# Patient Record
Sex: Male | Born: 1985 | Hispanic: Yes | Marital: Single | Smoking: Current some day smoker
Health system: Southern US, Community
[De-identification: ages and names within clinical notes are randomized; demographics above are authoritative.]

---

## 2015-05-30 ENCOUNTER — Encounter (HOSPITAL_COMMUNITY): Payer: Self-pay | Admitting: *Deleted

## 2015-05-30 ENCOUNTER — Encounter (HOSPITAL_BASED_OUTPATIENT_CLINIC_OR_DEPARTMENT_OTHER)
Admission: RE | Disposition: A | Discharge status: Home / Self Care | Payer: Self-pay | Source: Ambulatory Visit | Attending: Orthopedic Surgery

## 2015-05-30 ENCOUNTER — Ambulatory Visit (HOSPITAL_BASED_OUTPATIENT_CLINIC_OR_DEPARTMENT_OTHER): Payer: Worker's Compensation | Admitting: Anesthesiology

## 2015-05-30 ENCOUNTER — Emergency Department (HOSPITAL_COMMUNITY): Payer: Self-pay

## 2015-05-30 ENCOUNTER — Encounter (HOSPITAL_BASED_OUTPATIENT_CLINIC_OR_DEPARTMENT_OTHER): Payer: Self-pay | Admitting: *Deleted

## 2015-05-30 ENCOUNTER — Emergency Department (HOSPITAL_COMMUNITY)
Admission: EM | Admit: 2015-05-30 | Discharge: 2015-05-30 | Disposition: A | Discharge status: Home / Self Care | Payer: Self-pay | Attending: Emergency Medicine | Admitting: Emergency Medicine

## 2015-05-30 ENCOUNTER — Ambulatory Visit (HOSPITAL_BASED_OUTPATIENT_CLINIC_OR_DEPARTMENT_OTHER)
Admission: RE | Admit: 2015-05-30 | Discharge: 2015-05-30 | Disposition: A | Discharge status: Home / Self Care | Payer: Worker's Compensation | Source: Ambulatory Visit | Attending: Orthopedic Surgery | Admitting: Orthopedic Surgery

## 2015-05-30 ENCOUNTER — Other Ambulatory Visit: Payer: Self-pay | Admitting: Orthopedic Surgery

## 2015-05-30 DIAGNOSIS — X58XXXA Exposure to other specified factors, initial encounter: Secondary | ICD-10-CM | POA: Insufficient documentation

## 2015-05-30 DIAGNOSIS — S62602B Fracture of unspecified phalanx of right middle finger, initial encounter for open fracture: Secondary | ICD-10-CM

## 2015-05-30 DIAGNOSIS — Y929 Unspecified place or not applicable: Secondary | ICD-10-CM | POA: Diagnosis not present

## 2015-05-30 DIAGNOSIS — F1210 Cannabis abuse, uncomplicated: Secondary | ICD-10-CM | POA: Diagnosis not present

## 2015-05-30 DIAGNOSIS — Y9289 Other specified places as the place of occurrence of the external cause: Secondary | ICD-10-CM | POA: Insufficient documentation

## 2015-05-30 DIAGNOSIS — S62632B Displaced fracture of distal phalanx of right middle finger, initial encounter for open fracture: Secondary | ICD-10-CM | POA: Insufficient documentation

## 2015-05-30 DIAGNOSIS — Z23 Encounter for immunization: Secondary | ICD-10-CM | POA: Insufficient documentation

## 2015-05-30 DIAGNOSIS — Z720 Tobacco use: Secondary | ICD-10-CM | POA: Insufficient documentation

## 2015-05-30 DIAGNOSIS — F17210 Nicotine dependence, cigarettes, uncomplicated: Secondary | ICD-10-CM | POA: Insufficient documentation

## 2015-05-30 DIAGNOSIS — Y9389 Activity, other specified: Secondary | ICD-10-CM | POA: Insufficient documentation

## 2015-05-30 DIAGNOSIS — S62633B Displaced fracture of distal phalanx of left middle finger, initial encounter for open fracture: Secondary | ICD-10-CM | POA: Insufficient documentation

## 2015-05-30 DIAGNOSIS — Y990 Civilian activity done for income or pay: Secondary | ICD-10-CM | POA: Insufficient documentation

## 2015-05-30 DIAGNOSIS — S61214A Laceration without foreign body of right ring finger without damage to nail, initial encounter: Secondary | ICD-10-CM

## 2015-05-30 DIAGNOSIS — W208XXA Other cause of strike by thrown, projected or falling object, initial encounter: Secondary | ICD-10-CM | POA: Insufficient documentation

## 2015-05-30 HISTORY — PX: NAILBED REPAIR: SHX5028

## 2015-05-30 HISTORY — PX: FINGER CLOSED REDUCTION: SHX1633

## 2015-05-30 SURGERY — REPAIR, NAIL BED
Anesthesia: General | Site: Finger | Laterality: Right

## 2015-05-30 MED ORDER — MIDAZOLAM HCL 2 MG/2ML IJ SOLN
1.0000 mg | INTRAMUSCULAR | Status: DC | PRN
Start: 2015-05-30 — End: 2015-05-30
  Administered 2015-05-30: 2 mg via INTRAVENOUS

## 2015-05-30 MED ORDER — CEFAZOLIN SODIUM 1-5 GM-% IV SOLN
1.0000 g | Freq: Once | INTRAVENOUS | Status: AC
  Administered 2015-05-30: 1 g via INTRAVENOUS
  Filled 2015-05-30 (×2): qty 50

## 2015-05-30 MED ORDER — OXYCODONE-ACETAMINOPHEN 5-325 MG PO TABS: 1.0000 | ORAL_TABLET | ORAL | Status: AC | PRN

## 2015-05-30 MED ORDER — LIDOCAINE HCL (CARDIAC) 20 MG/ML IV SOLN
INTRAVENOUS | Status: DC | PRN
  Administered 2015-05-30: 80 mg via INTRAVENOUS

## 2015-05-30 MED ORDER — ONDANSETRON HCL 4 MG/2ML IJ SOLN
INTRAMUSCULAR | Status: DC | PRN
  Administered 2015-05-30: 4 mg via INTRAVENOUS

## 2015-05-30 MED ORDER — CHLORHEXIDINE GLUCONATE 4 % EX LIQD: 60.0000 mL | Freq: Once | CUTANEOUS | Status: DC

## 2015-05-30 MED ORDER — CEFAZOLIN SODIUM-DEXTROSE 2-3 GM-% IV SOLR
INTRAVENOUS | Status: AC
  Filled 2015-05-30: qty 50

## 2015-05-30 MED ORDER — FENTANYL CITRATE (PF) 100 MCG/2ML IJ SOLN: 25.0000 ug | INTRAMUSCULAR | Status: DC | PRN

## 2015-05-30 MED ORDER — GLYCOPYRROLATE 0.2 MG/ML IJ SOLN
0.2000 mg | Freq: Once | INTRAMUSCULAR | Status: AC | PRN
  Administered 2015-05-30: 0.2 mg via INTRAVENOUS

## 2015-05-30 MED ORDER — LACTATED RINGERS IV SOLN
INTRAVENOUS | Status: DC
  Administered 2015-05-30: 14:00:00 via INTRAVENOUS

## 2015-05-30 MED ORDER — FENTANYL CITRATE (PF) 100 MCG/2ML IJ SOLN
INTRAMUSCULAR | Status: AC
  Filled 2015-05-30: qty 4

## 2015-05-30 MED ORDER — ONDANSETRON HCL 4 MG/2ML IJ SOLN: 4.0000 mg | Freq: Four times a day (QID) | INTRAMUSCULAR | Status: DC | PRN

## 2015-05-30 MED ORDER — LIDOCAINE HCL 2 % IJ SOLN
10.0000 mL | Freq: Once | INTRAMUSCULAR | Status: AC
  Administered 2015-05-30: 200 mg
  Filled 2015-05-30: qty 20

## 2015-05-30 MED ORDER — SCOPOLAMINE 1 MG/3DAYS TD PT72: 1.0000 | MEDICATED_PATCH | Freq: Once | TRANSDERMAL | Status: DC | PRN

## 2015-05-30 MED ORDER — PROPOFOL 10 MG/ML IV BOLUS
INTRAVENOUS | Status: DC | PRN
  Administered 2015-05-30: 200 mg via INTRAVENOUS

## 2015-05-30 MED ORDER — BUPIVACAINE HCL (PF) 0.25 % IJ SOLN
INTRAMUSCULAR | Status: DC | PRN
  Administered 2015-05-30: 2 mL

## 2015-05-30 MED ORDER — DEXAMETHASONE SODIUM PHOSPHATE 4 MG/ML IJ SOLN
INTRAMUSCULAR | Status: DC | PRN
  Administered 2015-05-30: 10 mg via INTRAVENOUS

## 2015-05-30 MED ORDER — TETANUS-DIPHTH-ACELL PERTUSSIS 5-2.5-18.5 LF-MCG/0.5 IM SUSP
0.5000 mL | Freq: Once | INTRAMUSCULAR | Status: AC
  Administered 2015-05-30: 0.5 mL via INTRAMUSCULAR
  Filled 2015-05-30: qty 0.5

## 2015-05-30 MED ORDER — OXYCODONE HCL 5 MG PO TABS
ORAL_TABLET | ORAL | Status: AC
  Filled 2015-05-30: qty 1

## 2015-05-30 MED ORDER — OXYCODONE HCL 5 MG/5ML PO SOLN: 5.0000 mg | Freq: Once | ORAL | Status: AC | PRN

## 2015-05-30 MED ORDER — MIDAZOLAM HCL 2 MG/2ML IJ SOLN
INTRAMUSCULAR | Status: AC
  Filled 2015-05-30: qty 2

## 2015-05-30 MED ORDER — HYDROCODONE-ACETAMINOPHEN 5-325 MG PO TABS
2.0000 | ORAL_TABLET | Freq: Once | ORAL | Status: AC
  Administered 2015-05-30: 2 via ORAL
  Filled 2015-05-30: qty 2

## 2015-05-30 MED ORDER — FENTANYL CITRATE (PF) 100 MCG/2ML IJ SOLN
50.0000 ug | INTRAMUSCULAR | Status: DC | PRN
  Administered 2015-05-30: 100 ug via INTRAVENOUS

## 2015-05-30 MED ORDER — OXYCODONE HCL 5 MG PO TABS
5.0000 mg | ORAL_TABLET | Freq: Once | ORAL | Status: AC | PRN
  Administered 2015-05-30: 5 mg via ORAL

## 2015-05-30 MED ORDER — CEFAZOLIN SODIUM-DEXTROSE 2-3 GM-% IV SOLR
2.0000 g | INTRAVENOUS | Status: AC
  Administered 2015-05-30: 2 g via INTRAVENOUS

## 2015-05-30 SURGICAL SUPPLY — 67 items
APL SKNCLS STERI-STRIP NONHPOA (GAUZE/BANDAGES/DRESSINGS)
BANDAGE ELASTIC 3 VELCRO ST LF (GAUZE/BANDAGES/DRESSINGS) IMPLANT
BANDAGE ELASTIC 4 VELCRO ST LF (GAUZE/BANDAGES/DRESSINGS) IMPLANT
BENZOIN TINCTURE PRP APPL 2/3 (GAUZE/BANDAGES/DRESSINGS) IMPLANT
BLADE SURG 15 STRL LF DISP TIS (BLADE) ×2 IMPLANT
BLADE SURG 15 STRL SS (BLADE) ×4
BNDG CMPR 9X4 STRL LF SNTH (GAUZE/BANDAGES/DRESSINGS) ×2
BNDG CMPR MD 5X2 ELC HKLP STRL (GAUZE/BANDAGES/DRESSINGS)
BNDG COHESIVE 1X5 TAN STRL LF (GAUZE/BANDAGES/DRESSINGS) ×4 IMPLANT
BNDG ELASTIC 2 VLCR STRL LF (GAUZE/BANDAGES/DRESSINGS) IMPLANT
BNDG ESMARK 4X9 LF (GAUZE/BANDAGES/DRESSINGS) ×4 IMPLANT
BNDG GAUZE ELAST 4 BULKY (GAUZE/BANDAGES/DRESSINGS) IMPLANT
CANISTER SUCT 1200ML W/VALVE (MISCELLANEOUS) IMPLANT
CLOSURE WOUND 1/2 X4 (GAUZE/BANDAGES/DRESSINGS)
CORDS BIPOLAR (ELECTRODE) IMPLANT
COVER BACK TABLE 60X90IN (DRAPES) ×4 IMPLANT
CUFF TOURNIQUET SINGLE 18IN (TOURNIQUET CUFF) ×4 IMPLANT
DECANTER SPIKE VIAL GLASS SM (MISCELLANEOUS) IMPLANT
DRAPE EXTREMITY T 121X128X90 (DRAPE) ×4 IMPLANT
DRAPE OEC MINIVIEW 54X84 (DRAPES) ×4 IMPLANT
DRAPE SURG 17X23 STRL (DRAPES) ×4 IMPLANT
DURAPREP 26ML APPLICATOR (WOUND CARE) ×4 IMPLANT
GAUZE SPONGE 4X4 12PLY STRL (GAUZE/BANDAGES/DRESSINGS) ×4 IMPLANT
GAUZE SPONGE 4X4 16PLY XRAY LF (GAUZE/BANDAGES/DRESSINGS) IMPLANT
GAUZE XEROFORM 1X8 LF (GAUZE/BANDAGES/DRESSINGS) ×4 IMPLANT
GLOVE BIO SURGEON STRL SZ 6.5 (GLOVE) ×3 IMPLANT
GLOVE BIO SURGEONS STRL SZ 6.5 (GLOVE) ×1
GLOVE BIOGEL PI IND STRL 6.5 (GLOVE) ×2 IMPLANT
GLOVE BIOGEL PI IND STRL 7.0 (GLOVE) ×2 IMPLANT
GLOVE BIOGEL PI INDICATOR 6.5 (GLOVE) ×2
GLOVE BIOGEL PI INDICATOR 7.0 (GLOVE) ×2
GLOVE SURG SYN 8.0 (GLOVE) ×4 IMPLANT
GOWN STRL REUS W/ TWL LRG LVL3 (GOWN DISPOSABLE) ×2 IMPLANT
GOWN STRL REUS W/TWL LRG LVL3 (GOWN DISPOSABLE) ×4
GOWN STRL REUS W/TWL XL LVL3 (GOWN DISPOSABLE) ×4 IMPLANT
NEEDLE HYPO 25X1 1.5 SAFETY (NEEDLE) IMPLANT
NS IRRIG 1000ML POUR BTL (IV SOLUTION) IMPLANT
PACK BASIN DAY SURGERY FS (CUSTOM PROCEDURE TRAY) ×4 IMPLANT
PAD CAST 3X4 CTTN HI CHSV (CAST SUPPLIES) IMPLANT
PAD CAST 4YDX4 CTTN HI CHSV (CAST SUPPLIES) IMPLANT
PADDING CAST ABS 4INX4YD NS (CAST SUPPLIES) ×2
PADDING CAST ABS COTTON 4X4 ST (CAST SUPPLIES) ×2 IMPLANT
PADDING CAST COTTON 3X4 STRL (CAST SUPPLIES)
PADDING CAST COTTON 4X4 STRL (CAST SUPPLIES)
PADDING UNDERCAST 2 STRL (CAST SUPPLIES)
PADDING UNDERCAST 2X4 STRL (CAST SUPPLIES) IMPLANT
SHEET MEDIUM DRAPE 40X70 STRL (DRAPES) ×4 IMPLANT
SPLINT FINGER 2.25 911902 (SOFTGOODS) ×4 IMPLANT
SPLINT PLASTER CAST XFAST 4X15 (CAST SUPPLIES) IMPLANT
SPLINT PLASTER XTRA FAST SET 4 (CAST SUPPLIES)
STOCKINETTE 4X48 STRL (DRAPES) ×4 IMPLANT
STRIP CLOSURE SKIN 1/2X4 (GAUZE/BANDAGES/DRESSINGS) IMPLANT
SUCTION FRAZIER TIP 10 FR DISP (SUCTIONS) IMPLANT
SUT CHROMIC 6 0 G 1 (SUTURE) ×4 IMPLANT
SUT ETHILON 4 0 PS 2 18 (SUTURE) ×4 IMPLANT
SUT ETHILON 5 0 PS 2 18 (SUTURE) IMPLANT
SUT MERSILENE 4 0 P 3 (SUTURE) IMPLANT
SUT VIC AB 4-0 P-3 18XBRD (SUTURE) IMPLANT
SUT VIC AB 4-0 P3 18 (SUTURE)
SUT VICRYL RAPIDE 4-0 (SUTURE) IMPLANT
SUT VICRYL RAPIDE 4/0 PS 2 (SUTURE) IMPLANT
SYR BULB 3OZ (MISCELLANEOUS) ×4 IMPLANT
SYRINGE 10CC LL (SYRINGE) IMPLANT
TOWEL OR 17X24 6PK STRL BLUE (TOWEL DISPOSABLE) ×4 IMPLANT
TUBE CONNECTING 20'X1/4 (TUBING)
TUBE CONNECTING 20X1/4 (TUBING) IMPLANT
UNDERPAD 30X30 (UNDERPADS AND DIAPERS) ×4 IMPLANT

## 2015-05-30 NOTE — Discharge Instructions (Signed)

## 2015-05-30 NOTE — ED Notes (Signed)
The patient is soaking his finger in sterile water.

## 2015-05-30 NOTE — Transfer of Care (Signed)
Immediate Anesthesia Transfer of Care Note  Patient: Dean Peck  Procedure(s) Performed: Procedure(s): NAILBED REPAIR (Right) CLOSED REDUCTION ) FINGER (Right)  Patient Location: PACU  Anesthesia Type:General  Level of Consciousness: awake, sedated and patient cooperative  Airway & Oxygen Therapy: Patient Spontanous Breathing and Patient connected to face mask oxygen  Post-op Assessment: Report given to RN and Post -op Vital signs reviewed and stable  Post vital signs: Reviewed and stable  Last Vitals:  Filed Vitals:   05/30/15 1332  BP: 135/54  Pulse: 59  Temp: 36.5 C  Resp: 16    Complications: No apparent anesthesia complications

## 2015-05-30 NOTE — ED Notes (Signed)
Pt reports he injury to RT middle finger

## 2015-05-30 NOTE — Op Note (Signed)
See note [id number]

## 2015-05-30 NOTE — ED Provider Notes (Signed)
CSN: [id number]     Arrival date &  time 05/30/15  0900 History  This chart was scribed for Celene Skeenobyn Sharley Keeler, PA-C, working with Cathren LaineKevin Steinl, MD by Elon SpannerGarrett Cook, ED Scribe. This patient was seen in room TR06C/TR06C and the patient's care was started at 9:11 AM. '  Chief Complaint  Patient presents with  . Laceration   The history is provided by the patient. No language interpreter was used.  HPI Comments: Pricilla HandlerLouis Eliasen is a 29 y.o. male who presents to the Emergency Department complaining of his right middle finger laceration onset PTA. 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 No numbness at this time. Pain 10/10, worse with any pressure. No alleviating factors tried. Tdap out of date.   History reviewed. No pertinent past medical history. History reviewed. No pertinent past surgical history. History reviewed. No pertinent family history. History  Substance Use Topics  . Smoking status: Current Some Day Smoker    Types: Cigarettes  . Smokeless tobacco: Never Used  . Alcohol Use: 0.6 oz/week    1 Cans of beer per week    Review of Systems  Constitutional: Negative for fever.  Musculoskeletal:       + R middle finger pain.  Skin: Positive for wound.  All other systems reviewed and are negative.     Allergies  Review of patient's allergies indicates no known allergies.  Home Medications   Prior to Admission medications   Not on File   BP 138/71 mmHg  Pulse 56  Temp(Src) 98.3 F (36.8 C) (Oral)  Resp 18  SpO2 98% Physical Exam  Constitutional: He is oriented to person, place, and time. He appears well-developed and well-nourished. No distress.  HENT:  Head: Normocephalic and atraumatic.  Eyes: Conjunctivae and EOM are normal.  Neck: Normal range of motion. Neck supple.  Cardiovascular: Normal rate, regular rhythm and normal heart sounds.    Pulmonary/Chest: Effort normal and breath sounds normal.  Musculoskeletal:  Laceration at the distal tip of right middle finger has shown in image below. Able to flex and extend the MCP and PIP. Sensation intact distally.  Neurological: He is alert and oriented to person, place, and time.  Sensation intact.  Skin: Skin is warm and dry.  Psychiatric: He has a normal mood and affect. His behavior is normal.  Nursing note and vitals reviewed.       ED Course  Procedures (including critical care time)  DIAGNOSTIC STUDIES: Oxygen Saturation is 98% on RA, normal by my interpretation.    COORDINATION OF CARE:  9:16 AM Discussed treatment plan with patient at bedside.  Patient acknowledges and agrees with plan.     Labs Review Labs Reviewed - No data to display  Imaging Review Dg Finger Middle Right  05/30/2015   CLINICAL DATA:  Cut distal right third digit with small, initial encounter.  EXAM: RIGHT MIDDLE FINGER 2+V  COMPARISON:  None.  FINDINGS: There is a comminuted and mildly displaced fracture of the third distal phalanx, involving the shaft and tuft. Slight apex dorsal angulation of the fracture fragments. Fracture line does appear to extend to the distal interphalangeal joint, along the medial margin. Associated radiopaque debris.  IMPRESSION: Comminuted and somewhat displaced fracture of the third distal phalanx, as discussed above.   Electronically Signed   By: Leanna BattlesMelinda  Blietz M.D.   On: 05/30/2015 09:36  EKG Interpretation None      MDM   Final diagnoses:  Fracture of third finger, right, open, initial encounter  Laceration of third finger, right, complicated, initial encounter   Pt with open fracture of distal phalanx of R middle finger. I spoke with Molly Maduro, [place] for Dr. Mina Marble on call for hand, who wants the pt NPO, given a dose of abx, and go to the outpatient surgery center at 1:30 PM today for surgery. Patient given IV ancef, wound care given, wrapped, and  stable for d/c. Patient verbalizes instructions and agrees with plan of care.  Discussed with attending Dr. Denton Lank who also evaluated patient and agrees with plan of care.  I personally performed the services described in this documentation, which was scribed in my presence. The recorded information has been reviewed and is accurate.  Kathrynn Speed, PA-C 05/30/15 1106  Cathren Laine, MD 05/30/15 [phone]

## 2015-05-30 NOTE — H&P (Signed)
Dean HandlerLouis Peck is an 29 y.o. male.   Chief Complaint: right long finger crush HPI: as above s/p crush to right long finger with open distal phalanx fracture  History reviewed. No pertinent past medical history.  History reviewed. No pertinent past surgical history.  Family History  Problem Relation Age of Onset  . Diabetes Mellitus II Mother    Social History:  reports that he has been smoking Cigarettes.  He has never used smokeless tobacco. He reports that he drinks about 0.6 oz of alcohol per week. He reports that he uses illicit drugs (Marijuana).  Allergies: No Known Allergies  No prescriptions prior to admission    No results found for this or any previous visit (from the past 48 hour(s)). Dg Finger Middle Right  05/30/2015   CLINICAL DATA:  Cut distal right third digit with small, initial encounter.  EXAM: RIGHT MIDDLE FINGER 2+V  COMPARISON:  None.  FINDINGS: There is a comminuted and mildly displaced fracture of the third distal phalanx, involving the shaft and tuft. Slight apex dorsal angulation of the fracture fragments. Fracture line does appear to extend to the distal interphalangeal joint, along the medial margin. Associated radiopaque debris.  IMPRESSION: Comminuted and somewhat displaced fracture of the third distal phalanx, as discussed above.   Electronically Signed   By: Leanna BattlesMelinda  Blietz M.D.   On: 05/30/2015 09:36    Review of Systems  All other systems reviewed and are negative.   Blood pressure 135/54, pulse 59, temperature 97.7 F (36.5 C), temperature source Oral, resp. rate 16, height 5\' 4"  (1.626 m), weight 73.483 kg (162 lb), SpO2 100 %. Physical Exam  Constitutional: He is oriented to person, place, and time. He appears well-developed and well-nourished.  HENT:  Head: Normocephalic and atraumatic.  Cardiovascular: Normal rate.   Respiratory: Effort normal.  Musculoskeletal:       Right hand: He exhibits bony tenderness, deformity, laceration and swelling.   Right long finger open distal phalanx fracture  Neurological: He is alert and oriented to person, place, and time.  Skin: Skin is warm.  Psychiatric: He has a normal mood and affect. His behavior is normal. Judgment and thought content normal.     Assessment/Plan As above   Plan explore and repair as needed  [name] 05/30/2015, 2:36 PM

## 2015-05-30 NOTE — Anesthesia Postprocedure Evaluation (Signed)
Anesthesia Post Note  Patient: Dean Peck  Procedure(s) Performed: Procedure(s) (LRB): NAILBED REPAIR (Right) CLOSED REDUCTION ) FINGER (Right)  Anesthesia type: General  Patient location: PACU  Post pain: Pain level controlled and Adequate analgesia  Post assessment: Post-op Vital signs reviewed, Patient's Cardiovascular Status Stable, Respiratory Function Stable, Patent Airway and Pain level controlled  Last Vitals:  Filed Vitals:   05/30/15 1645  BP: 127/77  Pulse: 48  Temp:   Resp: 19    Post vital signs: Reviewed and stable  Level of consciousness: awake, alert  and oriented  Complications: No apparent anesthesia complications

## 2015-05-30 NOTE — Anesthesia Preprocedure Evaluation (Signed)
Anesthesia Evaluation  Patient identified by MRN, date of birth, ID band Patient awake    Reviewed: Allergy & Precautions, NPO status , Patient's Chart, lab work & pertinent test results  Airway Mallampati: II   Neck ROM: full    Dental   Pulmonary Current Smoker,    breath sounds clear to auscultation       Cardiovascular negative cardio ROS   Rhythm:regular Rate:Normal     Neuro/Psych    GI/Hepatic   Endo/Other    Renal/GU      Musculoskeletal   Abdominal   Peds  Hematology   Anesthesia Other Findings   Reproductive/Obstetrics                             Anesthesia Physical Anesthesia Plan  ASA: II  Anesthesia Plan: General   Post-op Pain Management:    Induction: Intravenous  Airway Management Planned: LMA  Additional Equipment:   Intra-op Plan:   Post-operative Plan:   Informed Consent: I have reviewed the patients History and Physical, chart, labs and discussed the procedure including the risks, benefits and alternatives for the proposed anesthesia with the patient or authorized representative who has indicated his/her understanding and acceptance.     Plan Discussed with: CRNA, Anesthesiologist and Surgeon  Anesthesia Plan Comments:         Anesthesia Quick Evaluation  

## 2015-05-30 NOTE — Discharge Instructions (Signed)
DO NOT HAVE ANYTHING TO EAT OR DRINK PRIOR TO SURGERY TODAY. AT 1:30, GO TO THE [facility] AT THE ADDRESS ABOVE FOR SURGERY ON YOUR FINGER.  Fracturas de los dedos Community education officer(Finger Fracture) Las fracturas de los dedos son rupturas de los huesos de los dedos. Hay diferentes tipos de fractura. Hay distintas formas de tratamiento para estas fracturas. Su mdico hablar con usted sobre la mejor manera de tratar la fractura. CAUSAS Una lesin traumtica es la causa principal de las fracturas de los dedos. Estas pueden ser:  Lesiones sufridas mientras se practica un deporte.  Lesiones en Immunologistel lugar de [place]ajo.  Cadas. FACTORES DE RIESGO Estas son algunas actividades que pueden aumentar su riesgo de sufrir fracturas de los dedos:  Deportes.  Actividades en Immunologistel lugar de trabajo que incluyen el uso de [place]quinaria.  Una afeccin denominada osteoporosis, que puede hacer que sus huesos sean menos densos y se fracturen con ms facilidad. SIGNOS Y SNTOMAS Los sntomas principales de una fractura de dedo son dolor e hinchazn dentro de los 15minutos posteriores a la lesin. Otros sntomas son:  Hematoma en el dedo.  Entumecimiento en el dedo.  Adormecimiento del dedo.  Huesos expuestos (fractura compuesta) si la fractura es grave. DIAGNSTICO  La mejor manera de diagnosticar una fractura de hueso es con radiografas. Adems, su mdico usar esta radiografa para evaluar la posicin de los huesos de los dedos fracturados.  TRATAMIENTO  Las fracturas de los dedos pueden tratarse con los siguientes mtodos:   No reduccin: significa que los huesos estn en su Environmental consultantlugar. El dedo se entablilla sin cambiar la posicin de las piezas de [place]. Generalmente la tablilla se deja entre una semana y [place]z das. Esto depender de la fractura y de lo que el mdico considere Sabanaadecuado.  Reduccin cerrada: los huesos se colocan nuevamente en su posicin, sin necesidad de [place]uga. Luego el dedo se  entablilla.  Reduccin abierta y fijacin interna: el lugar de la fractura est abierto. Luego las piezas de hueso se fijan en el lugar con clavos o con algn tipo de material duro. Con frecuencia esto es necesario. Depende de la gravedad de la fractura. INSTRUCCIONES PARA EL CUIDADO EN EL HOGAR   Siga las indicaciones del mdico en cuanto a la realizacin de actividades, ejercicios y fisioterapia.  Utilice los medicamentos de venta libre o recetados para Primary school teachercalmar el dolor, Environmental health practitionerel malestar o la fiebre, segn se lo indique el mdico. SOLICITE ATENCIN MDICA SI: Licensed conveyancerTiene dolor o hinchazn que limita el movimiento o el uso de los dedos. SOLICITE ATENCIN MDICA DE INMEDIATO SI:  Tiene adormecimiento en el dedo. ASEGRESE DE QUE:   Comprende estas instrucciones.  Controlar su afeccin.  Recibir ayuda de inmediato si no mejora o si empeora. Document Released: 08/27/2005 Document Revised: 09/07/2013 [facility]ExitCare Patient Information 2015 [place], [place]. This information is not intended to replace advice given to you by your health care provider. Make sure you discuss any questions you have with your health care provider.

## 2015-05-31 ENCOUNTER — Encounter (HOSPITAL_BASED_OUTPATIENT_CLINIC_OR_DEPARTMENT_OTHER): Payer: Self-pay | Admitting: Orthopedic Surgery

## 2015-05-31 NOTE — Op Note (Signed)
NAME:  Dean Peck, Dean Peck                 ACCOUNT NO.:  [id number]  MEDICAL RECORD NO.:  [id number]  LOCATION:                               FACILITY:  [facility]  PHYSICIAN:  Ricahrd Schwager A. Jakyia Gaccione, M.D.DATE OF BIRTH:  03/23/1986  DATE OF PROCEDURE:  05/30/2015 DATE OF DISCHARGE:  05/30/2015                              OPERATIVE REPORT   PREOPERATIVE DIAGNOSIS:  Right long finger open distal phalangeal fracture, nail bed laceration.  POSTOPERATIVE DIAGNOSIS:  Right long finger open distal phalangeal fracture, nail bed laceration.  PROCEDURE:  I and D of right long finger open distal phalangeal fracture with nail bed repair, open treatment fracture.  SURGEON:  Derward Marple A. Jozef Eisenbeis, M.D.  ASSISTANT:  None.  ANESTHESIA:  General.  COMPLICATIONS:  No complications.  DRAINS:  No drains.  DESCRIPTION OF PROCEDURE:  The patient was taken to the operating suite. After induction of general anesthetic, right upper extremity was prepped and draped in sterile fashion.  An Esmarch was used to exsanguinate the limb.  Tourniquet was inflated to 250 mmHg.  At this point in time, we approached the right small finger surgically, there was a grossly contaminated open distal phalangeal fracture, nail plate avulsion under the eponychial fold, and complex nail bed laceration.  We irrigated the wound completely.  We then went ahead and reduced the fracture closed including repositioning multiple comminuted fragments.  We then repaired the skin edges with 4-0 nylon and then the nail bed itself with 6-0 chromic x7 sutures to repair a complex stellate lacerations.  We then took the nail plate which had been removed from the underlying nail bed, soaked in Betadine, and removed all soft tissues and then placed it back over the eponychial fold.  Sutured distally with a 6-0 chromic.  We then dressed with Xeroform, 4x4s, Coban wrap, and a volar splint.  The patient tolerated the procedure and went to recovery room  in stable fashion.     Erdem Naas A. Tirzah Fross, M.D.   ______________________________ Pricella Gaugh A. Halie Gass, M.D.    MAW/MEDQ  D:  05/30/2015  T:  05/31/2015  Job:  [id number] 

## 2015-05-31 NOTE — Op Note (Deleted)
NAMPricilla Handler:  Dean Peck, Dean Peck                 ACCOUNT NO.:  [id number]  MEDICAL RECORD NO.:  [id number]  LOCATION:                               FACILITY:  [facility]  PHYSICIAN:  Artist PaisMatthew A. Eiza Canniff, M.D.DATE OF BIRTH:  1986/01/02  DATE OF PROCEDURE:  05/30/2015 DATE OF DISCHARGE:  05/30/2015                              OPERATIVE REPORT   PREOPERATIVE DIAGNOSIS:  Right long finger open distal phalangeal fracture, nail bed laceration.  POSTOPERATIVE DIAGNOSIS:  Right long finger open distal phalangeal fracture, nail bed laceration.  PROCEDURE:  I and D of right long finger open distal phalangeal fracture with nail bed repair, open treatment fracture.  SURGEON:  Artist PaisMatthew A. Mina MarbleWeingold, M.D.  ASSISTANT:  None.  ANESTHESIA:  General.  COMPLICATIONS:  No complications.  DRAINS:  No drains.  DESCRIPTION OF PROCEDURE:  The patient was taken to the operating suite. After induction of general anesthetic, right upper extremity was prepped and draped in sterile fashion.  An Esmarch was used to exsanguinate the limb.  Tourniquet was inflated to 250 mmHg.  At this point in time, we approached the right small finger surgically, there was a grossly contaminated open distal phalangeal fracture, nail plate avulsion under the eponychial fold, and complex nail bed laceration.  We irrigated the wound completely.  We then went ahead and reduced the fracture closed including repositioning multiple comminuted fragments.  We then repaired the skin edges with 4-0 nylon and then the nail bed itself with 6-0 chromic x7 sutures to repair a complex stellate lacerations.  We then took the nail plate which had been removed from the underlying nail bed, soaked in Betadine, and removed all soft tissues and then placed it back over the eponychial fold.  Sutured distally with a 6-0 chromic.  We then dressed with Xeroform, 4x4s, Coban wrap, and a volar splint.  The patient tolerated the procedure and went to recovery room  in stable fashion.     Artist PaisMatthew A. Mina MarbleWeingold, M.D.   ______________________________ Artist PaisMatthew A. Mina MarbleWeingold, M.D.    MAW/MEDQ  D:  05/30/2015  T:  05/31/2015  Job:  [id number]

## 2016-07-13 IMAGING — DX DG FINGER MIDDLE 2+V*R*
3 series · 3 of 3 positions shown · non-contrast
Comparison: None.

CLINICAL DATA: Cut distal right third digit with small, initial
encounter.

EXAM:
RIGHT MIDDLE FINGER 2+V

[finger ap]
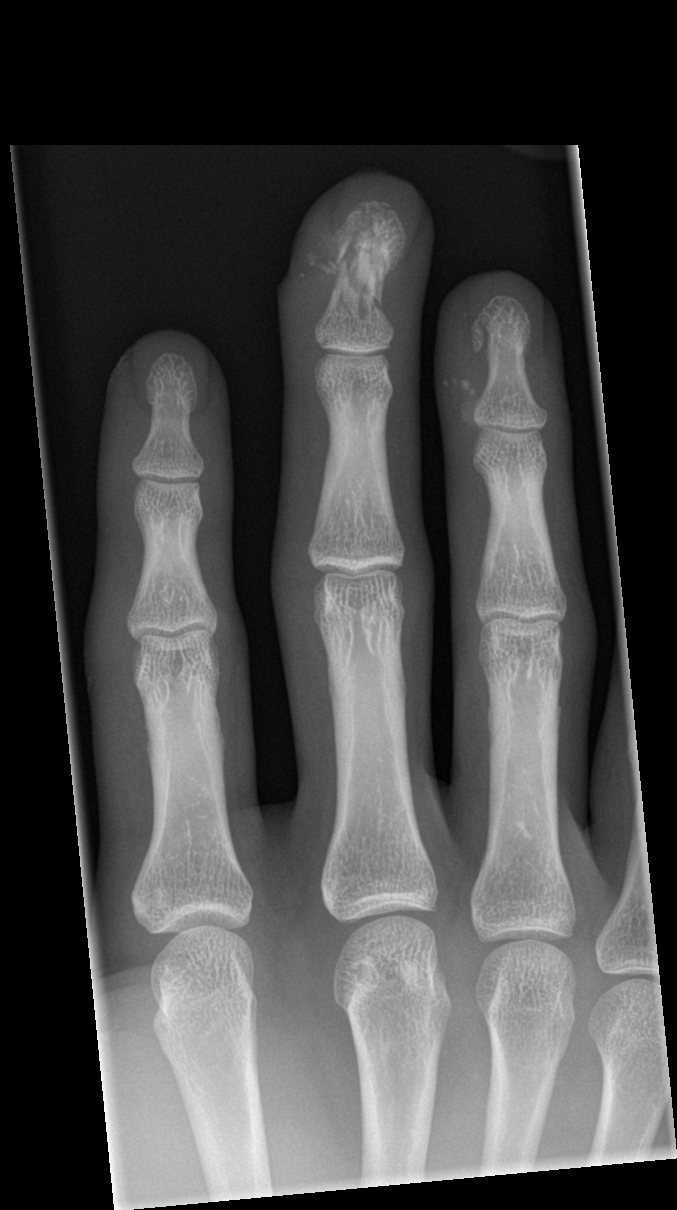

[finger obl]
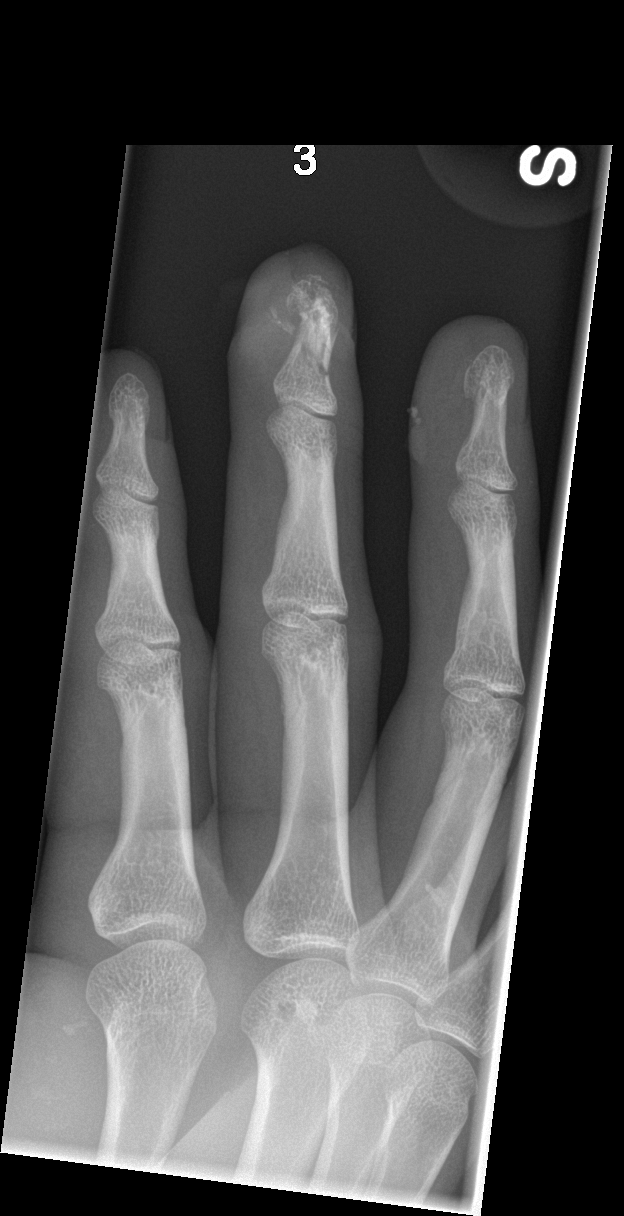

[finger lat]
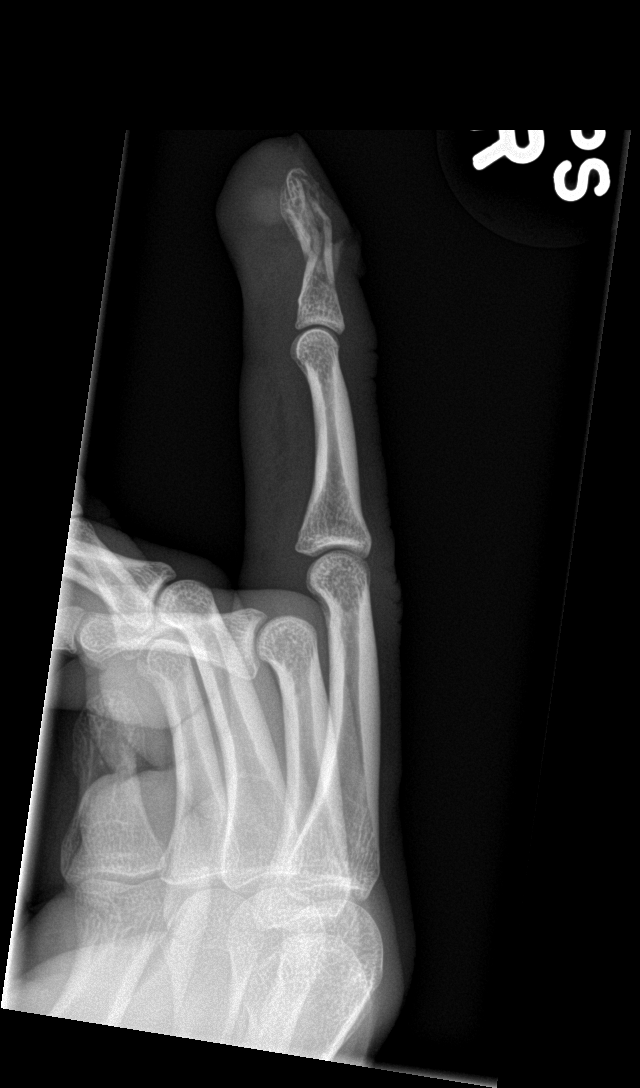

[3 of 3 positions shown; findings below may reference images not displayed]

FINDINGS: There is a comminuted and mildly displaced fracture of the third
distal phalanx, involving the shaft and tuft. Slight apex dorsal
angulation of the fracture fragments. Fracture line does appear to
extend to the distal interphalangeal joint, along the medial margin.
Associated radiopaque debris.
IMPRESSION: Comminuted and somewhat displaced fracture of the third distal
phalanx, as discussed above.
